# Patient Record
Sex: Female | Born: 1995 | Race: Black or African American | Hispanic: No | Marital: Single | Smoking: Never smoker
Health system: Southern US, Community
[De-identification: ages and names within clinical notes are randomized; demographics above are authoritative.]

## PROBLEM LIST (undated history)

## (undated) DIAGNOSIS — G43909 Migraine, unspecified, not intractable, without status migrainosus: Secondary | ICD-10-CM

## (undated) HISTORY — PX: TONSILLECTOMY: SUR1361

## (undated) HISTORY — PX: ADENOIDECTOMY: SUR15

---
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
## 2019-01-27 ENCOUNTER — Emergency Department (HOSPITAL_BASED_OUTPATIENT_CLINIC_OR_DEPARTMENT_OTHER)
Admission: EM | Admit: 2019-01-27 | Discharge: 2019-01-27 | Disposition: A | Discharge status: Home / Self Care | Payer: Self-pay | Attending: Emergency Medicine | Admitting: Emergency Medicine

## 2019-01-27 ENCOUNTER — Other Ambulatory Visit: Payer: Self-pay

## 2019-01-27 ENCOUNTER — Encounter (HOSPITAL_BASED_OUTPATIENT_CLINIC_OR_DEPARTMENT_OTHER): Payer: Self-pay | Admitting: Emergency Medicine

## 2019-01-27 DIAGNOSIS — K0889 Other specified disorders of teeth and supporting structures: Secondary | ICD-10-CM | POA: Insufficient documentation

## 2019-01-27 DIAGNOSIS — J029 Acute pharyngitis, unspecified: Secondary | ICD-10-CM | POA: Insufficient documentation

## 2019-01-27 MED ORDER — CLINDAMYCIN HCL 300 MG PO CAPS: 300.0000 mg | ORAL_CAPSULE | Freq: Three times a day (TID) | ORAL | 0 refills | Status: AC

## 2019-01-27 NOTE — ED Triage Notes (Signed)
Pt is c/o toothache on the right side lower  Pt is also c/o right ear pain and sore throat  Pt states her throat has been bothering her off and on for some time

## 2019-01-27 NOTE — ED Provider Notes (Signed)
[facility] EMERGENCY DEPARTMENT Provider Note   CSN: [id number] Arrival date & time: 01/27/19  2122     History   Chief Complaint Chief Complaint  Patient presents with  . Dental Pain  . Sore Throat    HPI Anita Martin is a 23 y.o. female.     The history is provided by the patient.  Dental Pain Location:  Upper Quality:  Aching Severity:  Mild Onset quality:  Gradual Timing:  Intermittent Progression:  Waxing and waning Chronicity:  New Relieved by:  Acetaminophen Worsened by:  Nothing Associated symptoms: no fever   Sore Throat Pertinent negatives include no chest pain, no abdominal pain and no shortness of breath.    History reviewed. No pertinent past medical history.  There are no active problems to display for this patient.   Past Surgical History:  Procedure Laterality Date  . ADENOIDECTOMY    . TONSILLECTOMY       OB History   No obstetric history on file.      Home Medications    Prior to Admission medications   Medication Sig Start Date End Date Taking? Authorizing Provider  cholecalciferol (VITAMIN D) 1000 UNITS tablet Take 1,000 Units by mouth daily.    [provider]  clindamycin (CLEOCIN) 300 MG capsule Take 1 capsule (300 mg total) by mouth 3 (three) times daily for 7 days. 01/27/19 02/03/19  Leonard Hendler, DO  cyclobenzaprine (FLEXERIL) 10 MG tablet Take 1 tablet (10 mg total) by mouth 3 (three) times daily as needed for muscle spasms. 05/14/13   Molpus, John, MD  ibuprofen (ADVIL,MOTRIN) 600 MG tablet Take 1 tab PO Q6h x 2 days then Q6h prn 05/11/12   Lowanda FosterBrewer, Mindy, NP  triamcinolone cream (KENALOG) 0.1 % Apply 1 application topically 2 (two) times daily. 05/17/14   Hess, Nada Boozerobyn M, PA-C    Family History History reviewed. No pertinent family history.  Social History Social History   Tobacco Use  . Smoking status: Never Smoker  . Smokeless tobacco: Never Used  Substance Use Topics  . Alcohol use: No  .  Drug use: No     Allergies   Penicillins and Shellfish allergy   Review of Systems Review of Systems  Constitutional: Negative for chills and fever.  HENT: Positive for sore throat. Negative for ear pain.   Eyes: Negative for pain and visual disturbance.  Respiratory: Negative for cough and shortness of breath.   Cardiovascular: Negative for chest pain and palpitations.  Gastrointestinal: Negative for abdominal pain and vomiting.  Genitourinary: Negative for dysuria and hematuria.  Musculoskeletal: Negative for arthralgias and back pain.  Skin: Negative for color change and rash.  Neurological: Negative for seizures and syncope.  All other systems reviewed and are negative.    Physical Exam Updated Vital Signs BP (!) 139/100 (BP Location: Right Arm)   Pulse (!) 102   Temp 98.9 F (37.2 C) (Oral)   Resp 16   Ht 5' 5.5" (1.664 m)   Wt 129.2 kg   LMP 01/17/2019 (Exact Date)   SpO2 99%   BMI 46.69 kg/m   Physical Exam Vitals signs and nursing note reviewed.  Constitutional:      General: She is not in acute distress.    Appearance: She is well-developed.  HENT:     Head: Normocephalic and atraumatic.     Mouth/Throat:     Mouth: Mucous membranes are moist. No oral lesions.     Pharynx: No pharyngeal swelling, oropharyngeal exudate,  posterior oropharyngeal erythema or uvula swelling.     Tonsils: No tonsillar exudate or tonsillar abscesses.  Eyes:     Conjunctiva/sclera: Conjunctivae normal.  Neck:     Musculoskeletal: Neck supple.  Cardiovascular:     Rate and Rhythm: Normal rate and regular rhythm.     Heart sounds: Normal heart sounds. No murmur.  Pulmonary:     Effort: Pulmonary effort is normal. No respiratory distress.     Breath sounds: Normal breath sounds.  Abdominal:     Palpations: Abdomen is soft.     Tenderness: There is no abdominal tenderness.  Skin:    General: Skin is warm and dry.  Neurological:     Mental Status: She is alert.       ED Treatments / Results  Labs (all labs ordered are listed, but only abnormal results are displayed) Labs Reviewed - No data to display  EKG None  Radiology No results found.  Procedures Procedures (including critical care time)  Medications Ordered in ED Medications - No data to display   Initial Impression / Assessment and Plan / ED Course  I have reviewed the triage vital signs and the nursing notes.  Pertinent labs & imaging results that were available during my care of the patient were reviewed by me and considered in my medical decision making (see chart for details).        Anita Martin is a 23 year old female here for dental pain.  Normal vitals.  No fever.  Has mild sore throat.  No signs of infection on exam.  No exudates.  Some dental caries to right upper teeth. No obvious abscess.  Patient given clindamycin prescription for possible periapical abscess.  Given information for dental clinic.  Discharged from ED in good condition.  Given return precautions.  This chart was dictated using voice recognition software.  Despite best efforts to proofread,  errors can occur which can change the documentation meaning.    Final Clinical Impressions(s) / ED Diagnoses   Final diagnoses:  Pain, dental    ED Discharge Orders         Ordered    clindamycin (CLEOCIN) 300 MG capsule  3 times daily     01/27/19 2216           Lennice Sites, DO 01/27/19 2219

## 2021-10-19 ENCOUNTER — Emergency Department (HOSPITAL_COMMUNITY)
Admission: EM | Admit: 2021-10-19 | Discharge: 2021-10-20 | Disposition: A | Discharge status: Home / Self Care | Payer: Self-pay | Attending: Emergency Medicine | Admitting: Emergency Medicine

## 2021-10-19 ENCOUNTER — Encounter (HOSPITAL_COMMUNITY): Payer: Self-pay | Admitting: Emergency Medicine

## 2021-10-19 ENCOUNTER — Other Ambulatory Visit: Payer: Self-pay

## 2021-10-19 DIAGNOSIS — R42 Dizziness and giddiness: Secondary | ICD-10-CM | POA: Insufficient documentation

## 2021-10-19 DIAGNOSIS — H6690 Otitis media, unspecified, unspecified ear: Secondary | ICD-10-CM | POA: Insufficient documentation

## 2021-10-19 LAB — CBC WITH DIFFERENTIAL/PLATELET
Abs Immature Granulocytes: 0.1 10*3/uL — ABNORMAL HIGH (ref 0.00–0.07)
Basophils Absolute: 0.1 10*3/uL (ref 0.0–0.1)
Basophils Relative: 1 %
Eosinophils Absolute: 0.2 10*3/uL (ref 0.0–0.5)
Eosinophils Relative: 2 %
HCT: 34.6 % — ABNORMAL LOW (ref 36.0–46.0)
Hemoglobin: 11.4 g/dL — ABNORMAL LOW (ref 12.0–15.0)
Immature Granulocytes: 1 %
Lymphocytes Relative: 43 %
Lymphs Abs: 5.2 10*3/uL — ABNORMAL HIGH (ref 0.7–4.0)
MCH: 26.9 pg (ref 26.0–34.0)
MCHC: 32.9 g/dL (ref 30.0–36.0)
MCV: 81.6 fL (ref 80.0–100.0)
Monocytes Absolute: 0.8 10*3/uL (ref 0.1–1.0)
Monocytes Relative: 7 %
Neutro Abs: 5.9 10*3/uL (ref 1.7–7.7)
Neutrophils Relative %: 46 %
Platelets: 430 10*3/uL — ABNORMAL HIGH (ref 150–400)
RBC: 4.24 MIL/uL (ref 3.87–5.11)
RDW: 16.7 % — ABNORMAL HIGH (ref 11.5–15.5)
WBC: 12.2 10*3/uL — ABNORMAL HIGH (ref 4.0–10.5)
nRBC: 0 % (ref 0.0–0.2)

## 2021-10-19 LAB — BASIC METABOLIC PANEL
Anion gap: 6 (ref 5–15)
BUN: 8 mg/dL (ref 6–20)
CO2: 25 mmol/L (ref 22–32)
Calcium: 8.9 mg/dL (ref 8.9–10.3)
Chloride: 107 mmol/L (ref 98–111)
Creatinine, Ser: 0.83 mg/dL (ref 0.44–1.00)
GFR, Estimated: >60 mL/min (ref >60)
Glucose, Bld: 103 mg/dL — ABNORMAL HIGH (ref 70–99)
Potassium: 4.1 mmol/L (ref 3.5–5.1)
Sodium: 138 mmol/L (ref 135–145)

## 2021-10-19 NOTE — ED Provider Notes (Signed)
?[facility]-EMERGENCY DEPT ?[facility] Emergency Department ?Provider Note ?MRN:  [id number]  ?Arrival date & time: 10/20/21    ? ?Chief Complaint   ?Dizziness ?  ?History of Present Illness   ?Anita Martin is a 26 y.o. year-old female with no pertinent past medical history presenting to the ED with chief complaint of dizziness. ? ?Intermittent dizziness for the past 1 to 2 weeks.  Described as a lightheadedness, seems to happen randomly.  Yesterday happened while she was sitting down eating lunch.  Today happened while she was walking in the mall.  Denies any chest pain or shortness of breath during these episodes, no headache or vision change, no recent leg pain or swelling, no abdominal pain, no recent bleeding, normal appetite, no numbness or weakness to the arms or legs.  Has been having a sensation of fullness to the left ear. ? ?Review of Systems  ?A thorough review of systems was obtained and all systems are negative except as noted in the HPI and PMH.  ? ?Patient's Health History   ?History reviewed. No pertinent past medical history.  ?Past Surgical History:  ?Procedure Laterality Date  ? ADENOIDECTOMY    ? TONSILLECTOMY    ?  ?No family history on file.  ?Social History  ? ?Socioeconomic History  ? Marital status: Single  ?  Spouse name: Not on file  ? Number of children: Not on file  ? Years of education: Not on file  ? Highest education level: Not on file  ?Occupational History  ? Not on file  ?Tobacco Use  ? Smoking status: Never  ? Smokeless tobacco: Never  ?Vaping Use  ? Vaping Use: Never used  ?Substance and Sexual Activity  ? Alcohol use: No  ? Drug use: No  ? Sexual activity: Never  ?  Birth control/protection: None  ?Other Topics Concern  ? Not on file  ?Social History Narrative  ? Not on file  ? ?Social Determinants of Health  ? ?Financial Resource Strain: Not on file  ?Food Insecurity: Not on file  ?Transportation Needs: Not on file  ?Physical Activity: Not on file  ?Stress: Not on file   ?Social Connections: Not on file  ?Intimate Partner Violence: Not on file  ?  ? ?Physical Exam  ? ?Vitals:  ? 10/19/21 2117 10/20/21 0023  ?BP:  117/81  ?Pulse:  94  ?Resp:  18  ?Temp: 98.9 ?F (37.2 ?C) 98.3 ?F (36.8 ?C)  ?SpO2:  100%  ?  ?CONSTITUTIONAL: Well-appearing, NAD ?NEURO/PSYCH:  Alert and oriented x 3, normal and symmetric strength and sensation, normal coordination, normal speech ?EYES:  eyes equal and reactive ?ENT/NECK:  no LAD, no JVD ?CARDIO: Regular rate, well-perfused, normal S1 and S2 ?PULM:  CTAB no wheezing or rhonchi ?GI/GU:  non-distended, non-tender ?MSK/SPINE:  No gross deformities, no edema ?SKIN:  no rash, atraumatic ? ? ?*Additional and/or pertinent findings included in MDM below ? ?Diagnostic and Interventional Summary  ? ? EKG Interpretation ? ?Date/Time:  Friday October 19 2021 21:38:14 EDT ?Ventricular Rate:  96 ?PR Interval:  126 ?QRS Duration: 70 ?QT Interval:  316 ?QTC Calculation: 399 ?R Axis:   69 ?Text Interpretation: Normal sinus rhythm Nonspecific T wave abnormality Abnormal ECG No previous ECGs available Confirmed by Kennis Carina [phone]) on 10/19/2021 11:30:44 PM ?  ? ?  ? ?Labs Reviewed  ?CBC WITH DIFFERENTIAL/PLATELET - Abnormal; Notable for the following components:  ?    Result Value  ? WBC 12.2 (*)   ? Hemoglobin  11.4 (*)   ? HCT 34.6 (*)   ? RDW 16.7 (*)   ? Platelets 430 (*)   ? Lymphs Abs 5.2 (*)   ? Abs Immature Granulocytes 0.10 (*)   ? All other components within normal limits  ?BASIC METABOLIC PANEL - Abnormal; Notable for the following components:  ? Glucose, Bld 103 (*)   ? All other components within normal limits  ?  ?No orders to display  ?  ?Medications - No data to display  ? ?Procedures  /  Critical Care ?Procedures ? ?ED Course and Medical Decision Making  ?Initial Impression and Ddx ?DDx includes cardiac arrhythmia, electrolyte disturbance, anemia, vertigo.  Overall very low concern for emergent process.  Normal neurological exam.  Given the ear symptoms  favoring either benign vertigo or benign presyncopal etiology.  Will obtain screening labs, EKG. ? ?Past medical/surgical history that increases complexity of ED encounter: None ? ?Interpretation of Diagnostics ?I personally reviewed the EKG and my interpretation is as follows: Sinus rhythm with normal intervals ?   ?Labs are reassuring with CBC/CMP, no significant blood count or electrolyte disturbance ? ?Patient Reassessment and Ultimate Disposition/Management ?Exam with otoscope reveals likely otitis media and otitis externa.  Will provide antibiotics.  No mastoid tenderness, no systemic symptoms, appropriate for discharge with treatment at home. ? ?Patient management required discussion with the following services or consulting groups:  None ? ?Complexity of Problems Addressed ?Acute illness or injury that poses threat of life of bodily function ? ?Additional Data Reviewed and Analyzed ?Further history obtained from: ?Prior labs/imaging results ? ?Additional Factors Impacting ED Encounter Risk ?Prescriptions ? ?Elmer Sow. Pilar Plate, MD ?[facility] Emergency Medicine ?[facility] [email or web address] ? ?Final Clinical Impressions(s) / ED Diagnoses  ? ?  ICD-10-CM   ?1. Dizziness  R42   ?  ?2. Ear infection  H66.90   ?  ?  ?ED Discharge Orders   ? ?      Ordered  ?  ofloxacin (FLOXIN) 0.3 % OTIC solution  2 times daily       ? 10/20/21 0021  ?  doxycycline (VIBRAMYCIN) 100 MG capsule  2 times daily       ? 10/20/21 0024  ? ?  ?  ? ?  ?  ? ?Discharge Instructions Discussed with and Provided to Patient:  ? ? ?Discharge Instructions   ? ?  ?You were evaluated in the Emergency Department and after careful evaluation, we did not find any emergent condition requiring admission or further testing in the hospital. ? ?Your exam/testing today was overall reassuring.  Symptoms seem to be due to an ear infection.  Please take the antibiotic pills and drops as directed and follow-up with your regular doctor if  symptoms do not go away. ? ?Please return to the Emergency Department if you experience any worsening of your condition.  Thank you for allowing [place] to be a part of your care. ? ? ? ? ?  ?Sabas Sous, MD ?10/20/21 0025 ? ?

## 2021-10-19 NOTE — ED Triage Notes (Signed)
Pt c/o dizzy spells x 1 week.  ?

## 2021-10-19 NOTE — ED Provider Triage Note (Signed)
Emergency Medicine Provider Triage Evaluation Note ? ?Anita Martin , a 26 y.o. female  was evaluated in triage.  Pt complains of dizzy spells x1 week.  Patient reports that while sitting at lunch 1-1/2 weeks ago she began experiencing a dizzy spell.  Patient reports that the spells have continued for the last week and a half.  She denies any history of vertigo.  Patient describes the dizzy spells as a combination of the room spinning and an inability for her to find balance.  Patient reports that she also has an extreme amount of earwax in her left ear. ? ?Review of Systems  ?Positive: Dizziness, ear pain ?Negative: Weakness, chest pain, shortness of breath ? ?Physical Exam  ?BP 139/90   Pulse (!) 101   Temp 98.9 ?F (37.2 ?C)   Resp 18   Ht 5\' 6"  (1.676 m)   Wt 136.1 kg   LMP 09/16/2021   SpO2 97%   BMI 48.42 kg/m?  ?Gen:   Awake, no distress   ?Resp:  Normal effort  ?MSK:   Moves extremities without difficulty  ?Other:  No focal neurodeficits noted on exam ? ?Medical Decision Making  ?Medically screening exam initiated at 9:31 PM.  Appropriate orders placed.  Anita Martin was informed that the remainder of the evaluation will be completed by another provider, this initial triage assessment does not replace that evaluation, and the importance of remaining in the ED until their evaluation is complete. ? ? ?  ?Azucena Cecil, PA-C ?10/19/21 2132 ? ?

## 2021-10-19 NOTE — Discharge Instructions (Addendum)
You were evaluated in the Emergency Department and after careful evaluation, we did not find any emergent condition requiring admission or further testing in the hospital. ? ?Your exam/testing today was overall reassuring.  Symptoms seem to be due to an ear infection.  Please take the antibiotic pills and drops as directed and follow-up with your regular doctor if symptoms do not go away. ? ?Please return to the Emergency Department if you experience any worsening of your condition.  Thank you for allowing [place] to be a part of your care. ? ?

## 2021-10-20 MED ORDER — OFLOXACIN 0.3 % OT SOLN: 10.0000 [drp] | Freq: Two times a day (BID) | OTIC | 0 refills | Status: AC

## 2021-10-20 MED ORDER — DOXYCYCLINE HYCLATE 100 MG PO CAPS: 100.0000 mg | ORAL_CAPSULE | Freq: Two times a day (BID) | ORAL | 0 refills | Status: AC

## 2022-01-22 ENCOUNTER — Emergency Department (HOSPITAL_COMMUNITY)
Admission: EM | Admit: 2022-01-22 | Discharge: 2022-01-22 | Disposition: A | Discharge status: Home / Self Care | Payer: 59 | Attending: Emergency Medicine | Admitting: Emergency Medicine

## 2022-01-22 ENCOUNTER — Other Ambulatory Visit: Payer: Self-pay

## 2022-01-22 ENCOUNTER — Encounter (HOSPITAL_COMMUNITY): Payer: Self-pay

## 2022-01-22 DIAGNOSIS — T50901A Poisoning by unspecified drugs, medicaments and biological substances, accidental (unintentional), initial encounter: Secondary | ICD-10-CM | POA: Diagnosis present

## 2022-01-22 DIAGNOSIS — T40711A Poisoning by cannabis, accidental (unintentional), initial encounter: Secondary | ICD-10-CM | POA: Diagnosis not present

## 2022-01-22 DIAGNOSIS — T6591XA Toxic effect of unspecified substance, accidental (unintentional), initial encounter: Secondary | ICD-10-CM

## 2022-01-22 DIAGNOSIS — X58XXXA Exposure to other specified factors, initial encounter: Secondary | ICD-10-CM | POA: Insufficient documentation

## 2022-01-22 NOTE — ED Triage Notes (Signed)
Patient BIB [facility] from home. Around 11 pm, she took 1 100mg  delta 8 gummy. Drinking wine with it. Feeling drowsy. Has never done the gummy before but scared because she is high.

## 2022-01-22 NOTE — Discharge Instructions (Signed)
Rest and drink fluids.  Avoid illicit substances. Return here for new concerns.

## 2022-01-22 NOTE — ED Provider Notes (Signed)
[facility]-EMERGENCY DEPT Provider Note   CSN: [id number] Arrival date & time: 01/22/22  0056     History  Chief Complaint  Patient presents with   Ingestion    Anita Martin is a 26 y.o. female.  The history is provided by the patient and medical records.  Ingestion   26 year old female with no significant past medical history presenting to the ED after ingestion.  At 11 PM she took a 100 mg delta 8 gummy, was drinking wine with it.  States she has never had edibles before and is concerned because she feels "high".  She has not had any vomiting.  She denies any pain.  Home Medications Prior to Admission medications   Medication Sig Start Date End Date Taking? Authorizing Provider  cholecalciferol (VITAMIN D) 1000 UNITS tablet Take 1,000 Units by mouth daily.    [provider]  cyclobenzaprine (FLEXERIL) 10 MG tablet Take 1 tablet (10 mg total) by mouth 3 (three) times daily as needed for muscle spasms. 05/14/13   Molpus, John, MD  ibuprofen (ADVIL,MOTRIN) 600 MG tablet Take 1 tab PO Q6h x 2 days then Q6h prn 05/11/12   Lowanda Foster, NP  triamcinolone cream (KENALOG) 0.1 % Apply 1 application topically 2 (two) times daily. 05/17/14   Hess, Nada Boozer, PA-C      Allergies    Penicillins and Shellfish allergy    Review of Systems   Review of Systems  Constitutional:        Ingestion  All other systems reviewed and are negative.   Physical Exam Updated Vital Signs BP (!) 141/110 (BP Location: Left Arm)   Pulse (!) 110   Temp 99 F (37.2 C) (Oral)   Resp 20   SpO2 100%   Physical Exam Vitals and nursing note reviewed.  Constitutional:      Appearance: She is well-developed.     Comments: Sleeping, awoken for exam and able to answer questions intermittently  HENT:     Head: Normocephalic and atraumatic.  Eyes:     Conjunctiva/sclera: Conjunctivae normal.     Pupils: Pupils are equal, round, and reactive to light.  Cardiovascular:      Rate and Rhythm: Normal rate and regular rhythm.     Heart sounds: Normal heart sounds.  Pulmonary:     Effort: Pulmonary effort is normal.     Breath sounds: Normal breath sounds.  Abdominal:     General: Bowel sounds are normal.     Palpations: Abdomen is soft.  Musculoskeletal:        General: Normal range of motion.     Cervical back: Normal range of motion.  Skin:    General: Skin is warm and dry.  Neurological:     Mental Status: She is alert and oriented to person, place, and time.     ED Results / Procedures / Treatments   Labs (all labs ordered are listed, but only abnormal results are displayed) Labs Reviewed - No data to display  EKG None  Radiology No results found.  Procedures Procedures    Medications Ordered in ED Medications - No data to display  ED Course/ Medical Decision Making/ A&P                           Medical Decision Making  26 year old female presenting to the ED after ingestion of 100 mg delta 8 gummy around 11 PM.  Has also been drinking  wine.  She is sleeping on exam but does arouse and is able to answer questions intermittently.  Vitals are stable.  She will simply need to metabolize.  1:56 AM Visitor here to take her home.  Advised to rest, hydrate and avoid further illicit substances.  Return here for new concerns.  Final Clinical Impression(s) / ED Diagnoses Final diagnoses:  Ingestion of substance, accidental or unintentional, initial encounter    Rx / DC Orders ED Discharge Orders     None         Garlon Hatchet, PA-C 01/22/22 0156    Pollyann Savoy, MD 01/22/22 [phone]

## 2022-02-02 ENCOUNTER — Other Ambulatory Visit: Payer: Self-pay

## 2022-02-02 ENCOUNTER — Encounter (HOSPITAL_BASED_OUTPATIENT_CLINIC_OR_DEPARTMENT_OTHER): Payer: Self-pay | Admitting: Emergency Medicine

## 2022-02-02 ENCOUNTER — Emergency Department (HOSPITAL_BASED_OUTPATIENT_CLINIC_OR_DEPARTMENT_OTHER)
Admission: EM | Admit: 2022-02-02 | Discharge: 2022-02-02 | Disposition: A | Discharge status: Home / Self Care | Payer: 59 | Attending: Emergency Medicine | Admitting: Emergency Medicine

## 2022-02-02 DIAGNOSIS — S61012D Laceration without foreign body of left thumb without damage to nail, subsequent encounter: Secondary | ICD-10-CM | POA: Insufficient documentation

## 2022-02-02 DIAGNOSIS — Z4802 Encounter for removal of sutures: Secondary | ICD-10-CM | POA: Diagnosis not present

## 2022-02-02 DIAGNOSIS — W268XXD Contact with other sharp object(s), not elsewhere classified, subsequent encounter: Secondary | ICD-10-CM | POA: Insufficient documentation

## 2022-02-02 NOTE — ED Provider Notes (Signed)
  [facility] EMERGENCY DEPARTMENT Provider Note   CSN: [id number] Arrival date & time: 02/02/22  1519     History  Chief Complaint  Patient presents with   Suture / Staple Removal    Anita Martin is a 26 y.o. female.   Suture / Staple Removal  Patient presents with sutures in her left thumb.  Had been placed 12 days ago.  Comes for suture removal.  Had been cut with a razor blade.  States she feels that it is healing well.  No numbness or weakness.  No fevers.     Home Medications Prior to Admission medications   Medication Sig Start Date End Date Taking? Authorizing Provider  cholecalciferol (VITAMIN D) 1000 UNITS tablet Take 1,000 Units by mouth daily.    [provider]  cyclobenzaprine (FLEXERIL) 10 MG tablet Take 1 tablet (10 mg total) by mouth 3 (three) times daily as needed for muscle spasms. 05/14/13   Molpus, John, MD  ibuprofen (ADVIL,MOTRIN) 600 MG tablet Take 1 tab PO Q6h x 2 days then Q6h prn 05/11/12   Lowanda Foster, NP  triamcinolone cream (KENALOG) 0.1 % Apply 1 application topically 2 (two) times daily. 05/17/14   Hess, Nada Boozer, PA-C      Allergies    Penicillins and Shellfish allergy    Review of Systems   Review of Systems  Physical Exam Updated Vital Signs BP 138/83   Pulse 79   Temp 98.2 F (36.8 C) (Oral)   Resp 16   SpO2 99%  Physical Exam Skin:    Comments: 5 sutures along horizontal wound on dorsum of left thumb.  Over proximal phalanx.  Mild swelling but no erythema.  Sutures removed without difficulty.  Good range of motion.  Neurological:     Mental Status: She is alert.     ED Results / Procedures / Treatments   Labs (all labs ordered are listed, but only abnormal results are displayed) Labs Reviewed - No data to display  EKG None  Radiology No results found.  Procedures Procedures    Medications Ordered in ED Medications - No data to display  ED Course/ Medical Decision Making/ A&P                            Medical Decision Making  Presents for suture removal.  Sutures about 7 days old.  Well-appearing wound without infection.  Sutures removed without difficulty.  Tolerated well and will discharge home        Final Clinical Impression(s) / ED Diagnoses Final diagnoses:  Visit for suture removal    Rx / DC Orders ED Discharge Orders     None         Benjiman Core, MD 02/02/22 1544

## 2022-02-02 NOTE — ED Triage Notes (Signed)
Pt here to have sutures on finger removed. Sutures placed about 10 days ago. Skin approximated. No redness or swelling noted.

## 2022-02-02 NOTE — ED Notes (Signed)
ED Provider at bedside. 

## 2024-04-11 ENCOUNTER — Other Ambulatory Visit: Payer: Self-pay

## 2024-04-11 ENCOUNTER — Encounter: Payer: Self-pay | Admitting: *Deleted

## 2024-04-11 ENCOUNTER — Ambulatory Visit
Admission: EM | Admit: 2024-04-11 | Discharge: 2024-04-11 | Disposition: A | Discharge status: Home / Self Care | Attending: Emergency Medicine | Admitting: Emergency Medicine

## 2024-04-11 DIAGNOSIS — J029 Acute pharyngitis, unspecified: Secondary | ICD-10-CM | POA: Insufficient documentation

## 2024-04-11 DIAGNOSIS — J019 Acute sinusitis, unspecified: Secondary | ICD-10-CM | POA: Diagnosis not present

## 2024-04-11 DIAGNOSIS — H9203 Otalgia, bilateral: Secondary | ICD-10-CM

## 2024-04-11 DIAGNOSIS — B9689 Other specified bacterial agents as the cause of diseases classified elsewhere: Secondary | ICD-10-CM | POA: Insufficient documentation

## 2024-04-11 DIAGNOSIS — H65192 Other acute nonsuppurative otitis media, left ear: Secondary | ICD-10-CM | POA: Insufficient documentation

## 2024-04-11 DIAGNOSIS — H60503 Unspecified acute noninfective otitis externa, bilateral: Secondary | ICD-10-CM | POA: Insufficient documentation

## 2024-04-11 HISTORY — DX: Migraine, unspecified, not intractable, without status migrainosus: G43.909

## 2024-04-11 LAB — POCT RAPID STREP A (OFFICE): Rapid Strep A Screen: NEGATIVE

## 2024-04-11 MED ORDER — OFLOXACIN 0.3 % OT SOLN: 3.0000 [drp] | Freq: Two times a day (BID) | OTIC | 0 refills | Status: AC

## 2024-04-11 MED ORDER — DOXYCYCLINE HYCLATE 100 MG PO CAPS: 100.0000 mg | ORAL_CAPSULE | Freq: Two times a day (BID) | ORAL | 0 refills | Status: AC

## 2024-04-11 NOTE — ED Triage Notes (Addendum)
 Pt reports her ears have been bothering her for about a week and she has been picking at them. Her throat has been scratchy since tues-wed. Also c/o runny nose. Denies fever. Taking Robitussin (day and night), cough drops and zyrtec without relief.

## 2024-04-11 NOTE — Discharge Instructions (Addendum)
 The strep test was negative  For external ear infection (both ears) -- use the FLOXIN  ear drops twice daily for 5 days. Please avoid any q-tip use!  For internal ear infection (left ear) and sinus infection -- take the antibiotic Doxycycline  directed. Twice daily ofr 7 days in a row. Take with food to avoid upset stomach. Finish all the medicine -- you should not have any leftover  You can also continue tylenol and ibuprofen  I also recommend daily flonase for ear pressure/fullness

## 2024-04-11 NOTE — ED Provider Notes (Signed)
 [facility]    CSN: [id number] Arrival date & time: 04/11/24  1009      History   Chief Complaint Chief Complaint  Patient presents with   Otalgia    HPI Anita Martin is a 28 y.o. female.  1 week of bilateral ear pain, fullness, and ear canal itching  Also nasal congestion persisting over the 7 days Minimal cough A few days of sore throat, improving. Tonsils removed No fever, chills, abd pain, NVD  Uses q-tips in the ears   Tried robitussin, cough drops, zyrtec  Past Medical History:  Diagnosis Date   Migraine     There are no active problems to display for this patient.   Past Surgical History:  Procedure Laterality Date   ADENOIDECTOMY     TONSILLECTOMY      OB History   No obstetric history on file.      Home Medications    Prior to Admission medications   Medication Sig Start Date End Date Taking? Authorizing Provider  acetaZOLAMIDE ER (DIAMOX) 500 MG capsule Take 1,000 mg by mouth 2 (two) times daily. 08/23/22  Yes [provider]  cholecalciferol (VITAMIN D) 1000 UNITS tablet Take 1,000 Units by mouth daily.   Yes [provider]  doxycycline  (VIBRAMYCIN ) 100 MG capsule Take 1 capsule (100 mg total) by mouth 2 (two) times daily for 7 days. 04/11/24 04/18/24 Yes Johnice Riebe, [place], PA-C  ofloxacin  (FLOXIN ) 0.3 % OTIC solution Place 3 drops into both ears 2 (two) times daily for 5 days. 04/11/24 04/16/24 Yes Tiffony Kite, [place], PA-C  Ubrogepant 100 MG TABS Take 100 mg by mouth. 08/14/22  Yes [provider]  cyclobenzaprine  (FLEXERIL ) 10 MG tablet Take 1 tablet (10 mg total) by mouth 3 (three) times daily as needed for muscle spasms. Patient not taking: Reported on 04/11/2024 05/14/13   Molpus, Norleen, MD  ibuprofen  (ADVIL ,MOTRIN ) 600 MG tablet Take 1 tab PO Q6h x 2 days then Q6h prn Patient not taking: Reported on 04/11/2024 05/11/12   Eilleen Colander, NP  triamcinolone  cream (KENALOG ) 0.1 % Apply 1 application topically 2  (two) times daily. Patient not taking: Reported on 04/11/2024 05/17/14   Devona Catheryn HERO, PA-C    Family History History reviewed. No pertinent family history.  Social History Social History   Tobacco Use   Smoking status: Never   Smokeless tobacco: Never  Vaping Use   Vaping status: Never Used  Substance Use Topics   Alcohol use: No   Drug use: No     Allergies   Penicillins and Shellfish allergy   Review of Systems Review of Systems As per HPI  Physical Exam Triage Vital Signs ED Triage Vitals  Encounter Vitals Group     BP 04/11/24 1157 115/81     Girls Systolic BP Percentile --      Girls Diastolic BP Percentile --      Boys Systolic BP Percentile --      Boys Diastolic BP Percentile --      Pulse Rate 04/11/24 1157 74     Resp 04/11/24 1157 20     Temp 04/11/24 1157 98.7 F (37.1 C)     Temp Source 04/11/24 1157 Oral     SpO2 04/11/24 1157 97 %     Weight --      Height --      Head Circumference --      Peak Flow --      Pain Score 04/11/24 1207 2  Pain Loc --      Pain Education --      Exclude from Growth Chart --    No data found.  Updated Vital Signs BP 115/81 (BP Location: Left Arm)   Pulse 74   Temp 98.7 F (37.1 C) (Oral)   Resp 20   LMP 03/23/2024   SpO2 97%    Physical Exam Vitals and nursing note reviewed.  Constitutional:      Appearance: She is not ill-appearing.  HENT:     Left Ear: Tympanic membrane is injected and erythematous.     Ears:     Comments: Left TM injected, erythematous. Right TM normal. Bilat canals irritated and erythematous     Nose: Congestion present. No rhinorrhea.     Mouth/Throat:     Mouth: Mucous membranes are moist.     Pharynx: Oropharynx is clear. No posterior oropharyngeal erythema.     Tonsils: 0 on the right. 0 on the left.     Comments: Mild erythema of uvula, no swelling, midline. No exudate. Normal phonation, tolerating secretions  Eyes:     Conjunctiva/sclera: Conjunctivae normal.   Cardiovascular:     Rate and Rhythm: Normal rate and regular rhythm.     Pulses: Normal pulses.     Heart sounds: Normal heart sounds.  Pulmonary:     Effort: Pulmonary effort is normal.     Breath sounds: Normal breath sounds.  Abdominal:     Palpations: Abdomen is soft.     Tenderness: There is no abdominal tenderness.  Musculoskeletal:     Cervical back: Normal range of motion.  Lymphadenopathy:     Cervical: No cervical adenopathy.  Skin:    General: Skin is warm and dry.  Neurological:     Mental Status: She is alert and oriented to person, place, and time.     UC Treatments / Results  Labs (all labs ordered are listed, but only abnormal results are displayed) Labs Reviewed  POCT RAPID STREP A (OFFICE) - Normal  CULTURE, GROUP A STREP [facility])    EKG  Radiology No results found.  Procedures Procedures (including critical care time)  Medications Ordered in UC Medications - No data to display  Initial Impression / Assessment and Plan / UC Course  I have reviewed the triage vital signs and the nursing notes.  Pertinent labs & imaging results that were available during my care of the patient were reviewed by me and considered in my medical decision making (see chart for details).  Afebrile, well appearing  Rapid strep negative, will culture  Left otitis media and bacterial sinusitis Doxycycline  BID x 7 days  Bilat otitis externa Ofloxacin  BID x 5 days  Continue other supportive care Return if needed Agrees to plan, no questions Note for work provided  Final Clinical Impressions(s) / UC Diagnoses   Final diagnoses:  Sore throat  Acute ear pain, bilateral  Other acute nonsuppurative otitis media of left ear, recurrence not specified  Acute otitis externa of both ears, unspecified type  Acute bacterial sinusitis     Discharge Instructions      The strep test was negative  For external ear infection (both ears) -- use the FLOXIN  ear drops twice  daily for 5 days. Please avoid any q-tip use!  For internal ear infection (left ear) and sinus infection -- take the antibiotic Doxycycline  directed. Twice daily ofr 7 days in a row. Take with food to avoid upset stomach. Finish all the medicine --  you should not have any leftover  You can also continue tylenol and ibuprofen  I also recommend daily flonase for ear pressure/fullness    ED Prescriptions     Medication Sig Dispense Auth. Provider   ofloxacin  (FLOXIN ) 0.3 % OTIC solution Place 3 drops into both ears 2 (two) times daily for 5 days. 5 mL Deseray Daponte, PA-C   doxycycline  (VIBRAMYCIN ) 100 MG capsule Take 1 capsule (100 mg total) by mouth 2 (two) times daily for 7 days. 14 capsule Jeraldean Wechter, [place], PA-C      PDMP not reviewed this encounter.   Jamesyn Lindell, [place], PA-C 04/11/24 1240

## 2024-04-14 ENCOUNTER — Ambulatory Visit (HOSPITAL_COMMUNITY): Payer: Self-pay

## 2024-04-14 LAB — CULTURE, GROUP A STREP (THRC)
